# Patient Record
Sex: Male | Born: 2000 | Race: Black or African American | Hispanic: No | Marital: Single
Health system: Southern US, Community
[De-identification: ages and names within clinical notes are randomized; demographics above are authoritative.]

---
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
## 2021-03-22 ENCOUNTER — Emergency Department (HOSPITAL_COMMUNITY): Payer: Self-pay

## 2021-03-22 ENCOUNTER — Emergency Department (HOSPITAL_COMMUNITY)
Admission: EM | Admit: 2021-03-22 | Discharge: 2021-04-20 | Disposition: E | Payer: Self-pay | Attending: Emergency Medicine | Admitting: Emergency Medicine

## 2021-03-22 DIAGNOSIS — S0180XA Unspecified open wound of other part of head, initial encounter: Secondary | ICD-10-CM | POA: Insufficient documentation

## 2021-03-22 DIAGNOSIS — T1490XA Injury, unspecified, initial encounter: Secondary | ICD-10-CM

## 2021-03-22 DIAGNOSIS — W3400XA Accidental discharge from unspecified firearms or gun, initial encounter: Secondary | ICD-10-CM | POA: Insufficient documentation

## 2021-03-22 LAB — CBC
HCT: 38.6 % — ABNORMAL LOW (ref 39.0–52.0)
Hemoglobin: 11.4 g/dL — ABNORMAL LOW (ref 13.0–17.0)
MCH: 28.6 pg (ref 26.0–34.0)
MCHC: 29.5 g/dL — ABNORMAL LOW (ref 30.0–36.0)
MCV: 96.7 fL (ref 80.0–100.0)
Platelets: 9 10*3/uL — CL (ref 150–400)
RBC: 3.99 MIL/uL — ABNORMAL LOW (ref 4.22–5.81)
RDW: 12.8 % (ref 11.5–15.5)
WBC: 9.1 10*3/uL (ref 4.0–10.5)
nRBC: 0.3 % — ABNORMAL HIGH (ref 0.0–0.2)

## 2021-03-22 MED ORDER — EPINEPHRINE 1 MG/10ML IJ SOSY
PREFILLED_SYRINGE | INTRAMUSCULAR | Status: AC | PRN
  Administered 2021-03-22 (×2): 1 mg via INTRAVENOUS

## 2021-03-22 MED ORDER — EPINEPHRINE 1 MG/10ML IJ SOSY
PREFILLED_SYRINGE | INTRAMUSCULAR | Status: AC | PRN
  Administered 2021-03-22: 1 mg via INTRAVENOUS

## 2021-04-20 NOTE — Code Documentation (Signed)
Patient time of death occurred at 29 .
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
## 2021-04-20 NOTE — Procedures (Signed)
   Procedure Note  Date: 2021-04-03  Procedure: central venous catheter placement--right, femoral vein, without ultrasound guidance  Pre-op diagnosis:  CPR Post-op diagnosis: same  Surgeon: Diamantina Monks, MD  Anesthesia: none EBL: <5cc Drains/Implants: triple lumen central venous catheter  Description of procedure: This procedure was performed emergently, therefore informed consent was not obtained, and was performed under non-sterile conditions. The right femoral  vein was localized using anatomic landmarks, accessed using an introducer needle, and a guidewire passed through the needle. The needle was removed and a skin nick was made. The tract was dilated and the central venous catheter advanced over the guidewire followed by removal of the guidewire. All ports drew blood easily and all were flushed with saline. The catheter was secured to the skin with suture.   Diamantina Monks, MD General and Trauma Surgery [facility] Surgery
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
## 2021-04-20 DEATH — deceased
